# Patient Record
Sex: Female | Born: 1990 | Race: Black or African American | Hispanic: No | Marital: Single | Smoking: Never smoker
Health system: Southern US, Community
[De-identification: ages and names within clinical notes are randomized; demographics above are authoritative.]

---

## 2011-07-02 ENCOUNTER — Emergency Department (HOSPITAL_COMMUNITY): Payer: 59

## 2011-07-02 ENCOUNTER — Emergency Department (HOSPITAL_COMMUNITY)
Admission: EM | Admit: 2011-07-02 | Discharge: 2011-07-03 | Disposition: A | Discharge status: Home / Self Care | Payer: 59 | Attending: Emergency Medicine | Admitting: Emergency Medicine

## 2011-07-02 DIAGNOSIS — M7989 Other specified soft tissue disorders: Secondary | ICD-10-CM | POA: Insufficient documentation

## 2011-07-02 DIAGNOSIS — M25579 Pain in unspecified ankle and joints of unspecified foot: Secondary | ICD-10-CM | POA: Insufficient documentation

## 2011-07-02 DIAGNOSIS — X500XXA Overexertion from strenuous movement or load, initial encounter: Secondary | ICD-10-CM | POA: Insufficient documentation

## 2011-07-02 DIAGNOSIS — S93409A Sprain of unspecified ligament of unspecified ankle, initial encounter: Secondary | ICD-10-CM | POA: Insufficient documentation

## 2012-01-06 IMAGING — CR DG ANKLE COMPLETE 3+V*L*
3 series · 3 of 3 positions shown · non-contrast
Comparison: None.

CLINICAL DATA: Status post fall; rolled left ankle, with lateral
ankle pain and swelling.

LEFT ANKLE COMPLETE - 3+ VIEW

[t ankle joint ap left]
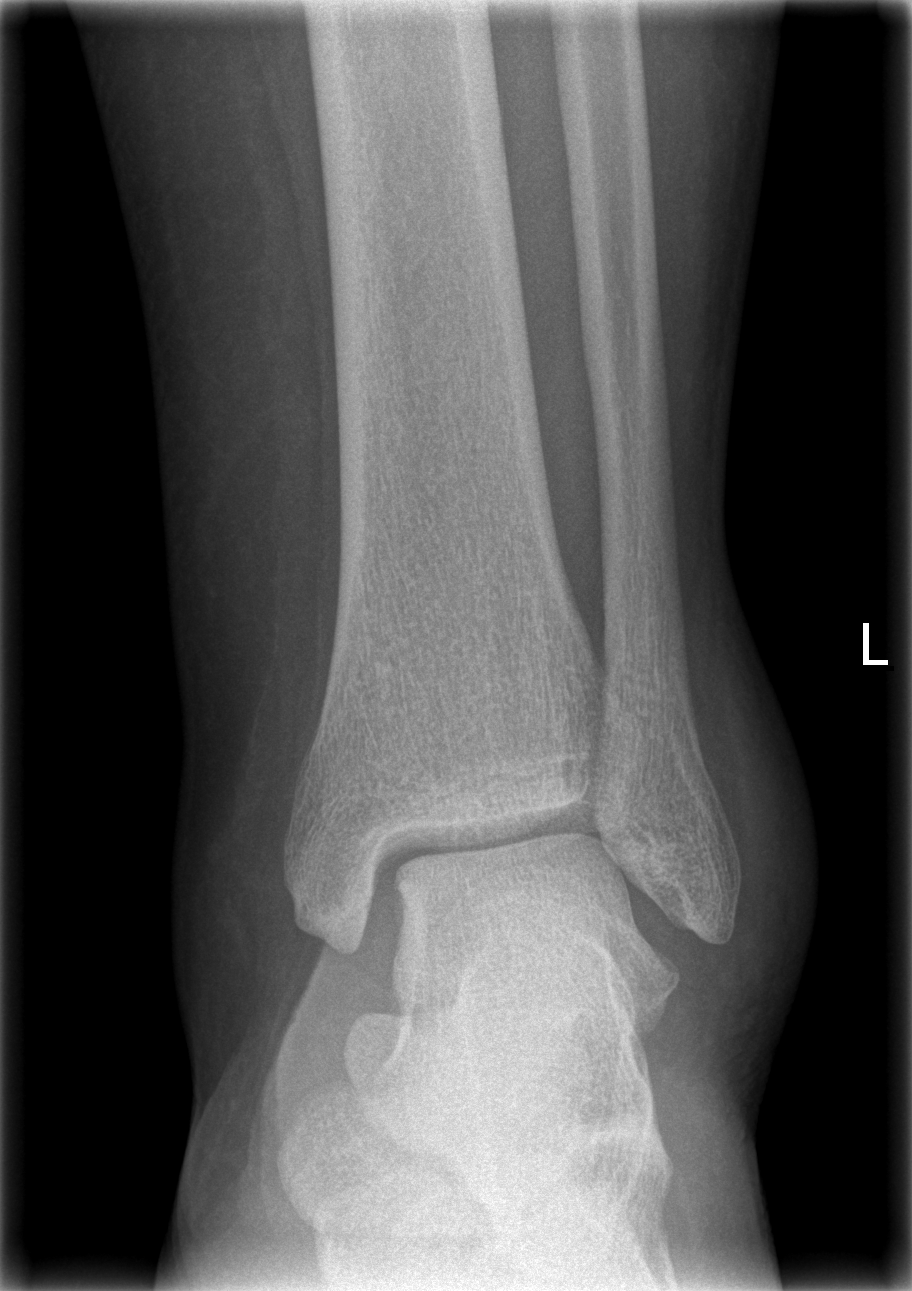

[t ankle joint oblique left]
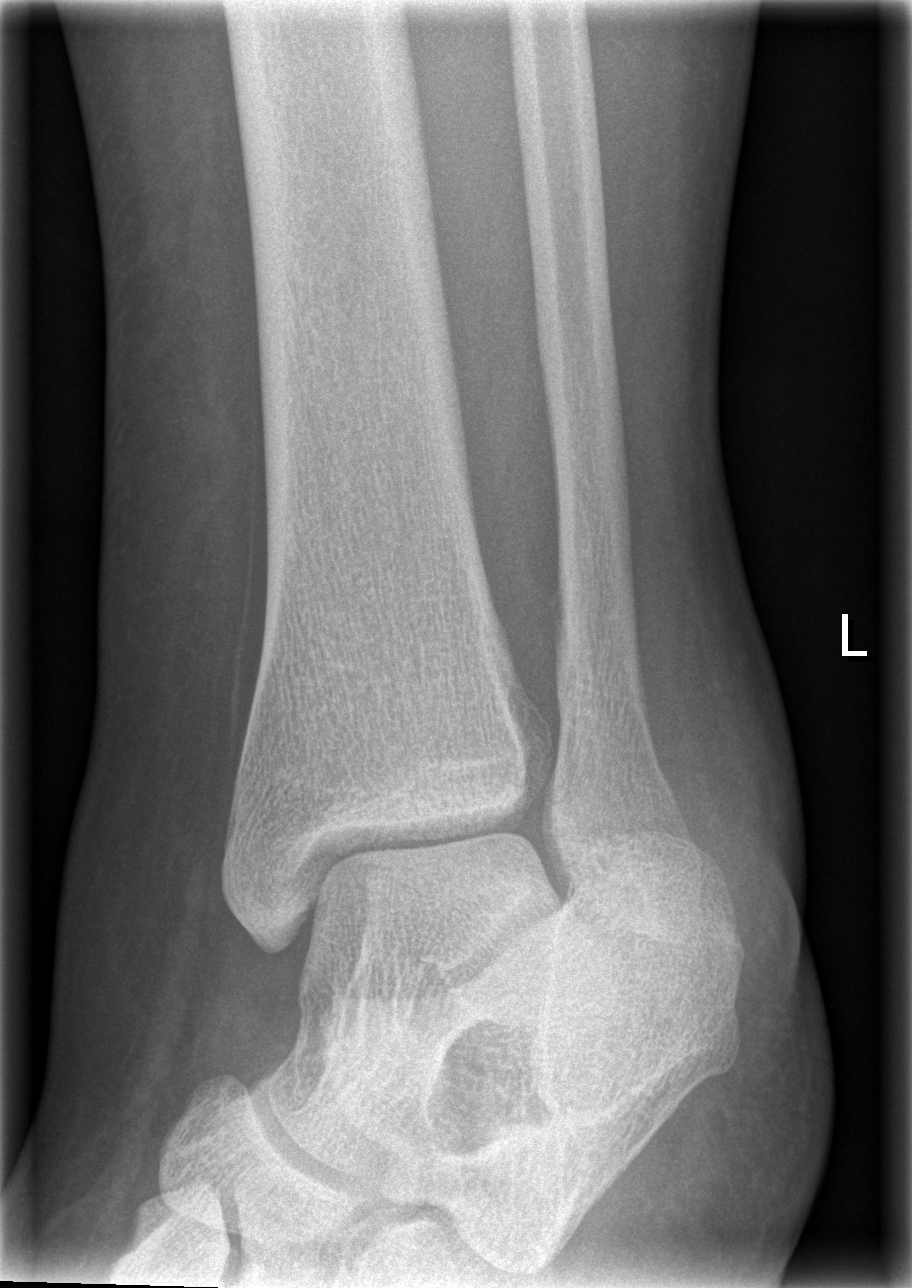

[t ankle joint lat left]
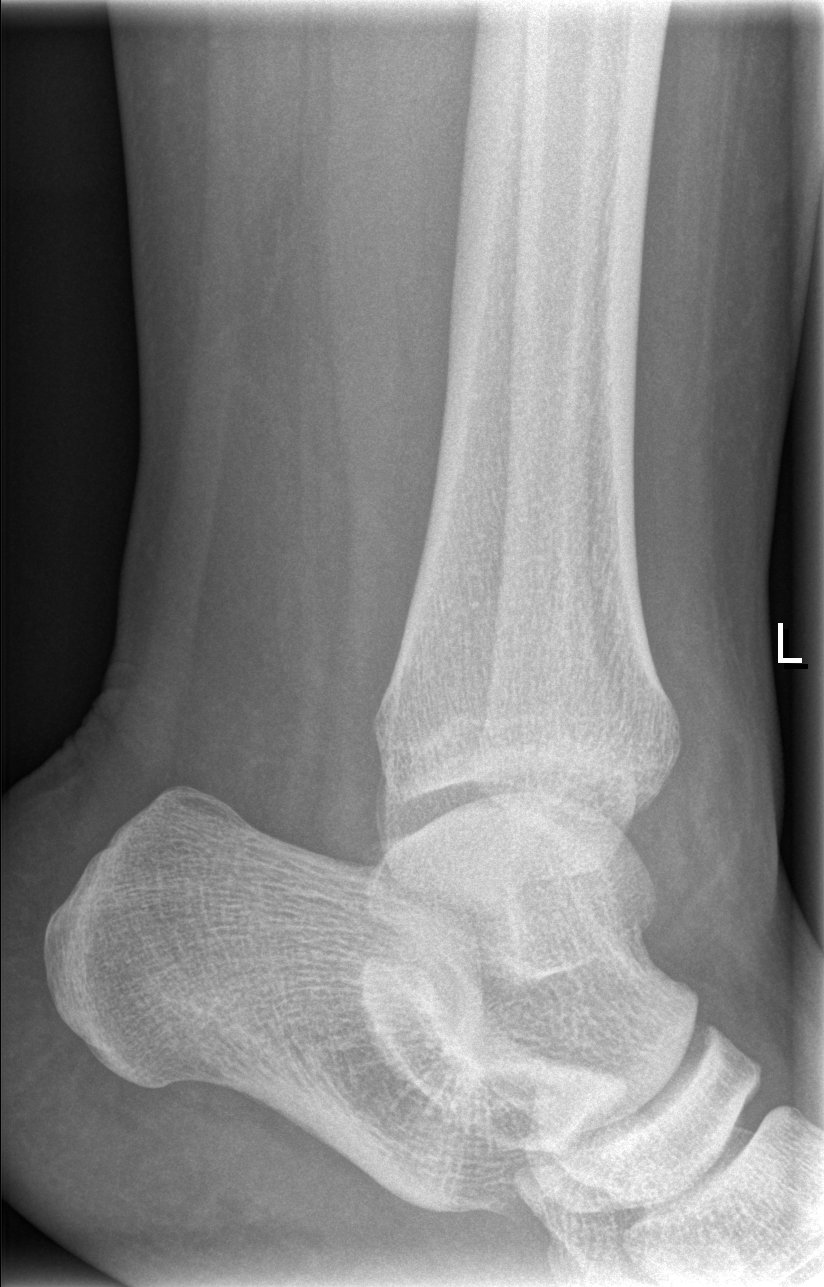

[3 of 3 positions shown; findings below may reference images not displayed]

FINDINGS: There is no evidence of fracture or dislocation.  The
ankle mortise is intact; the interosseous space is within normal
limits.  No talar tilt or subluxation is seen. Apparent pes planus
is noted.

The joint spaces are preserved.  Diffuse soft tissue swelling is
noted about the ankle.
IMPRESSION: 1.  No evidence of fracture or dislocation.
2.  Diffuse soft tissue swelling about the ankle.
3.  Apparent pes planus noted.

## 2014-08-30 ENCOUNTER — Ambulatory Visit (INDEPENDENT_AMBULATORY_CARE_PROVIDER_SITE_OTHER): Payer: 59 | Admitting: Emergency Medicine

## 2014-08-30 VITALS — BP 124/80 | HR 89 | Temp 98.2°F | Resp 18 | Ht 66.0 in | Wt 315.6 lb

## 2014-08-30 DIAGNOSIS — R059 Cough, unspecified: Secondary | ICD-10-CM

## 2014-08-30 DIAGNOSIS — J029 Acute pharyngitis, unspecified: Secondary | ICD-10-CM

## 2014-08-30 DIAGNOSIS — R05 Cough: Secondary | ICD-10-CM

## 2014-08-30 LAB — POCT RAPID STREP A (OFFICE): RAPID STREP A SCREEN: NEGATIVE

## 2014-08-30 MED ORDER — GUAIFENESIN ER 1200 MG PO TB12: 1.0000 | ORAL_TABLET | Freq: Two times a day (BID) | ORAL | Status: DC | PRN

## 2014-08-30 NOTE — Patient Instructions (Signed)
Drink plenty of water and get plenty of rest. We will let you know the results of your culture.

## 2014-08-30 NOTE — Progress Notes (Signed)
   Subjective:    Patient ID: Lauren Fleming, female    DOB: 11/01/1991, 23 y.o.   MRN: [id number]  Sore Throat  Associated symptoms include coughing. Pertinent negatives include no congestion, diarrhea, ear pain, headaches or vomiting.    This is a 23 year old female with no significant PMH here with 3 days of coughing, sneezing and sore throat. The cough is productive of green sputum. It is not keeping her up at night. She denies fever, chills, facial pain, otalgia, or GI symptoms. 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 She has taken dayquil without relief. She is not a smoker and does not have a history of asthma. She reports she got strep throat every fall until she was 23 years old. She has not had strep throat since that age.   Review of Systems  Constitutional: Positive for fatigue. Negative for fever.  HENT: Positive for sneezing and sore throat. Negative for congestion, ear pain and sinus pressure.   Eyes: Negative.   Respiratory: Positive for cough.   Gastrointestinal: Negative for nausea, vomiting and diarrhea.  Skin: Negative for rash.  Neurological: Negative for headaches.       Objective:   Physical Exam  Constitutional: She is oriented to person, place, and time. She appears well-developed and well-nourished. No distress.  HENT:  Head: Normocephalic and atraumatic.  Right Ear: Hearing, tympanic membrane, external ear and ear canal normal.  Left Ear: Hearing, tympanic membrane, external ear and ear canal normal.  Nose: Nose normal.  Mouth/Throat: Uvula is midline. Posterior oropharyngeal erythema present. No oropharyngeal exudate.  Tonsils are hypercryptic and erythematous,  2+ in size  Eyes: Conjunctivae and lids are normal. Right eye exhibits no discharge. Left eye exhibits no discharge. No scleral icterus.  Cardiovascular: Normal rate, regular rhythm, normal heart sounds and normal pulses.   Pulmonary/Chest: Effort normal and breath sounds  normal. No respiratory distress. She has no wheezes. She has no rhonchi. She has no rales.  Lymphadenopathy:    She has no cervical adenopathy.  Neurological: She is alert and oriented to person, place, and time.  Skin: Skin is warm, dry and intact.  Psychiatric: She has a normal mood and affect. Her speech is normal and behavior is normal. Thought content normal.    Results for orders placed in visit on 08/30/14  POCT RAPID STREP A (OFFICE)      Result Value Ref Range   Rapid Strep A Screen Negative  Negative      Assessment & Plan:  1. Acute pharyngitis, unspecified pharyngitis type  Patient's pharyngitis is likely viral. Rapid strep negative, culture pending. She will try salt water gargles, ibuprofen and/or tylenol for discomfort and mucinex for her cough. She will RTC  If symptoms fail to improve.  - POCT rapid strep A - Culture, Group A Strep - Guaifenesin (MUCINEX MAXIMUM STRENGTH) 1200 MG TB12; Take 1 tablet (1,200 mg total) by mouth every 12 (twelve) hours as needed.  Dispense: 14 tablet; Refill: 1   Aalaya Yadao V. Dyke BrackettBush, PA-C, MHS [facility]  08/30/2014

## 2014-08-31 LAB — CULTURE, GROUP A STREP

## 2014-09-01 ENCOUNTER — Other Ambulatory Visit: Payer: Self-pay | Admitting: Physician Assistant

## 2014-09-01 DIAGNOSIS — J020 Streptococcal pharyngitis: Secondary | ICD-10-CM

## 2014-09-01 MED ORDER — AMOXICILLIN 875 MG PO TABS: 875.0000 mg | ORAL_TABLET | Freq: Two times a day (BID) | ORAL | Status: AC

## 2014-09-26 ENCOUNTER — Ambulatory Visit (INDEPENDENT_AMBULATORY_CARE_PROVIDER_SITE_OTHER): Payer: 59 | Admitting: Physician Assistant

## 2014-09-26 VITALS — BP 116/82 | HR 82 | Temp 98.4°F | Resp 12 | Ht 66.0 in | Wt 312.6 lb

## 2014-09-26 DIAGNOSIS — J029 Acute pharyngitis, unspecified: Secondary | ICD-10-CM

## 2014-09-26 LAB — POCT RAPID STREP A (OFFICE): Rapid Strep A Screen: NEGATIVE

## 2014-09-26 MED ORDER — MAGIC MOUTHWASH W/LIDOCAINE: 10.0000 mL | ORAL | Status: AC | PRN

## 2014-09-26 MED ORDER — AMOXICILLIN-POT CLAVULANATE 875-125 MG PO TABS: 1.0000 | ORAL_TABLET | Freq: Two times a day (BID) | ORAL | Status: AC

## 2014-09-26 NOTE — Patient Instructions (Signed)
Get plenty of rest and drink at least 64 ounces of water daily.  Take acetaminophen and/or ibuprofen as needed for throat pain.

## 2014-09-26 NOTE — Progress Notes (Signed)
Subjective:    Patient ID: Lauren Fleming, female    DOB: 03/20/1991, 23 y.o.   MRN: [id number]   PCP: No PCP Per Patient  Chief Complaint  Patient presents with  . Sore Throat    strep about 2-3 weeks ago and she feels this is back     No Known Allergies  There are no active problems to display for this patient.   Prior to Admission medications   Not on File    Medical, Surgical, Family and Social History reviewed and updated.  HPI This 23 y.o. female presents for evaluation of sore throat.  Diagnosed with strep throat several weeks ago by culture (rapid strep was negative). Completed the antibiotics (Amoxicillin) without adverse effect.  "it kind of never fully went away." Throat felt tight with swallowing, hard to open her mouth really wide.  This morning awoke with recurrent pain like she had initially. No runny nose or cough. Feels like a mild drip in the throat, that she coughs up and spits out. Some loss of voice.  Review of Systems As above.    Objective:   Physical Exam  Constitutional: She is oriented to person, place, and time. Vital signs are normal. She appears well-developed and well-nourished. No distress.  BP 116/82 mmHg  Pulse 82  Temp(Src) 98.4 F (36.9 C) (Oral)  Resp 12  Ht 5\' 6"  (1.676 m)  Wt 312 lb 9.6 oz (141.794 kg)  BMI 50.48 kg/m2  SpO2 94%  LMP 09/16/2014   HENT:  Head: Normocephalic and atraumatic.  Right Ear: Hearing, tympanic membrane, external ear and ear canal normal.  Left Ear: Hearing, tympanic membrane, external ear and ear canal normal.  Nose: Mucosal edema and rhinorrhea present.  No foreign bodies. Right sinus exhibits no maxillary sinus tenderness and no frontal sinus tenderness. Left sinus exhibits no maxillary sinus tenderness and no frontal sinus tenderness.  Mouth/Throat: Uvula is midline and mucous membranes are normal. No uvula swelling. Posterior oropharyngeal edema and posterior oropharyngeal erythema present. No  oropharyngeal exudate or tonsillar abscesses.    Eyes: Conjunctivae and EOM are normal. Pupils are equal, round, and reactive to light. Right eye exhibits no discharge. Left eye exhibits no discharge. No scleral icterus.  Neck: Trachea normal, normal range of motion and full passive range of motion without pain. Neck supple. No thyroid mass and no thyromegaly present.  Cardiovascular: Normal rate, regular rhythm and normal heart sounds.   Pulmonary/Chest: Effort normal and breath sounds normal.  Lymphadenopathy:       Head (right side): No submandibular, no tonsillar, no preauricular, no posterior auricular and no occipital adenopathy present.       Head (left side): No submandibular, no tonsillar, no preauricular and no occipital adenopathy present.    She has no cervical adenopathy.       Right: No supraclavicular adenopathy present.       Left: No supraclavicular adenopathy present.  Neurological: She is alert and oriented to person, place, and time. She has normal strength. No cranial nerve deficit or sensory deficit.  Skin: Skin is warm, dry and intact. No rash noted.  Psychiatric: She has a normal mood and affect. Her speech is normal and behavior is normal.      Results for orders placed or performed in visit on 09/26/14  POCT rapid strep A  Result Value Ref Range   Rapid Strep A Screen Negative Negative       Assessment & Plan:  1. Acute pharyngitis, unspecified  pharyngitis type Cover for recurrent strep throat. Given mild fullness of the LEFT peritonsillar area, counseled on the importance of re-evaluation if symptoms worsen/persist. Hydrate. Rest. NSAIDS. - POCT rapid strep A - amoxicillin-clavulanate (AUGMENTIN) 875-125 MG per tablet; Take 1 tablet by mouth 2 (two) times daily.  Dispense: 20 tablet; Refill: 0 - Alum & Mag Hydroxide-Simeth (MAGIC MOUTHWASH W/LIDOCAINE) SOLN; Take 10 mLs by mouth every 2 (two) hours as needed for mouth pain.  Dispense: 360 mL; Refill: 0 -  Culture, Group A Strep   Fernande Brashelle S. Nicco Reaume, PA-C Physician Assistant-Certified [facility]

## 2014-09-28 ENCOUNTER — Ambulatory Visit (INDEPENDENT_AMBULATORY_CARE_PROVIDER_SITE_OTHER): Payer: 59 | Admitting: Physician Assistant

## 2014-09-28 VITALS — BP 126/86 | HR 97 | Temp 97.6°F | Resp 18 | Ht 66.0 in | Wt 307.0 lb

## 2014-09-28 DIAGNOSIS — R070 Pain in throat: Secondary | ICD-10-CM

## 2014-09-28 LAB — POCT CBC
GRANULOCYTE PERCENT: 77 % (ref 37–80)
HCT, POC: 41.3 % (ref 37.7–47.9)
Hemoglobin: 13.5 g/dL (ref 12.2–16.2)
LYMPH, POC: 2.1 (ref 0.6–3.4)
MCH, POC: 28.8 pg (ref 27–31.2)
MCHC: 32.7 g/dL (ref 31.8–35.4)
MCV: 88 fL (ref 80–97)
MID (CBC): 0.2 (ref 0–0.9)
MPV: 7.1 fL (ref 0–99.8)
PLATELET COUNT, POC: 339 10*3/uL (ref 142–424)
POC GRANULOCYTE: 7.8 — AB (ref 2–6.9)
POC LYMPH %: 20.7 % (ref 10–50)
POC MID %: 2.3 % (ref 0–12)
RBC: 4.69 M/uL (ref 4.04–5.48)
RDW, POC: 13.5 %
WBC: 10.1 10*3/uL (ref 4.6–10.2)

## 2014-09-28 NOTE — Progress Notes (Signed)
Subjective:    Patient ID: Lauren Fleming, female    DOB: 06/12/1991, 23 y.o.   MRN: [id number]  Chief Complaint  Patient presents with  . Follow-up  . Sore Throat     HPI  This is a 23 year old female who is here today for follow up of the acute pharyngitis.  He was first given amoxicillin about 30 days ago for 10 days.  2 weeks past and she still has the same sorethroat and is given the augmentin.  She is here for 48 hr (day 3/10 augmentin round) followup per provider request.  She reports that the sorethroat has not improved, but also has not worsened.   Sore throat has not gotten any better.  The sore throat feels about the same.  It hurts to talk.  Hurts to open mouth wide.  No fever, no chills.  Some headaches on left side of face.  No eye pain.  Ear pain at left side.  She had recurrent strep as a child, but has never lasted this long.    Review of Systems  Constitutional: Negative for fever.  HENT: Positive for drooling, ear pain and sore throat. Negative for ear discharge, postnasal drip and rhinorrhea.   Respiratory: Negative for apnea and cough.   Gastrointestinal: Negative for nausea, vomiting and diarrhea.  Neurological: Negative for dizziness, light-headedness and headaches.       Objective:   Physical Exam  Constitutional: She is oriented to person, place, and time. She appears well-developed and well-nourished. No distress.  BP 126/86 mmHg  Pulse 97  Temp(Src) 97.6 F (36.4 C) (Oral)  Resp 18  Ht 5\' 6"  (1.676 m)  Wt 307 lb (139.254 kg)  BMI 49.57 kg/m2  SpO2 98%  LMP 09/16/2014   HENT:  Head: Normocephalic and atraumatic.  Right Ear: Tympanic membrane is not injected.  Left Ear: There is tenderness (Tenderness when puling ear lobe during examination). Tympanic membrane is bulging. Tympanic membrane is not injected.  Nose: No mucosal edema or rhinorrhea. Right sinus exhibits no maxillary sinus tenderness and no frontal sinus tenderness. Left sinus exhibits no  maxillary sinus tenderness and no frontal sinus tenderness.  Mouth/Throat: There is trismus in the jaw. Posterior oropharyngeal edema and tonsillar abscesses (Left tonsillar swelling) present. No oropharyngeal exudate.  Eyes: Conjunctivae are normal. Pupils are equal, round, and reactive to light. Right eye exhibits no discharge. Left eye exhibits no discharge.  Neck: Normal range of motion. Neck supple. No rigidity. No tracheal deviation and no erythema present. No thyromegaly present.  Cardiovascular: Normal rate, regular rhythm and normal heart sounds.  Exam reveals no gallop and no friction rub.   No murmur heard. Pulmonary/Chest: Effort normal and breath sounds normal. No respiratory distress. She has no wheezes.  Lymphadenopathy:       Head (right side): No submandibular, no tonsillar, no preauricular and no posterior auricular adenopathy present.       Head (left side): Submandibular, tonsillar and preauricular adenopathy present. No posterior auricular adenopathy present.    She has no cervical adenopathy.  Neurological: She is alert and oriented to person, place, and time.  Skin: Skin is warm and dry.  Psychiatric: She has a normal mood and affect. Her behavior is normal. Judgment and thought content normal.    BP 126/86 mmHg  Pulse 97  Temp(Src) 97.6 F (36.4 C) (Oral)  Resp 18  Ht 5\' 6"  (1.676 m)  Wt 307 lb (139.254 kg)  BMI 49.57 kg/m2  SpO2  98%  LMP 09/16/2014  Results for orders placed or performed in visit on 09/28/14  POCT CBC  Result Value Ref Range   WBC 10.1 4.6 - 10.2 K/uL   Lymph, poc 2.1 0.6 - 3.4   POC LYMPH PERCENT 20.7 10 - 50 %L   MID (cbc) 0.2 0 - 0.9   POC MID % 2.3 0 - 12 %M   POC Granulocyte 7.8 (A) 2 - 6.9   Granulocyte percent 77.0 37 - 80 %G   RBC 4.69 4.04 - 5.48 M/uL   Hemoglobin 13.5 12.2 - 16.2 g/dL   HCT, POC 16.141.3 09.637.7 - 47.9 %   MCV 88.0 80 - 97 fL   MCH, POC 28.8 27 - 31.2 pg   MCHC 32.7 31.8 - 35.4 g/dL   RDW, POC 04.513.5 %   Platelet  Count, POC 339 142 - 424 K/uL   MPV 7.1 0 - 99.8 fL       Assessment & Plan:  23 year old female is here today for followup of sore throat and pharyngitis that has not resolved for about 1 month.   Throat pain  POCT CBC Very suspicious that this is a peritonsillar abscess.  Diff dx: atypical pharyngitis, cellulitis, retropharyngeal abscess (although neck ROM) is wnl.  [facility] workup and confirmation of illness.      Trena PlattStephanie Andreanna Mikolajczak, PA-C [facility] 11/20/201512:52 PM

## 2014-09-28 NOTE — Progress Notes (Signed)
I was directly involved with the patient's care and agree with the diagnosis and treatment plan.  

## 2014-09-28 NOTE — Patient Instructions (Signed)
[facility] - please go now to this office for your evaluation. Address: [address],  [place], [place] [address]  [phone]

## 2014-09-29 LAB — CULTURE, GROUP A STREP: ORGANISM ID, BACTERIA: NORMAL

## 2015-07-12 ENCOUNTER — Ambulatory Visit (INDEPENDENT_AMBULATORY_CARE_PROVIDER_SITE_OTHER): Payer: 59 | Admitting: Emergency Medicine

## 2015-07-12 VITALS — BP 110/68 | HR 96 | Temp 98.5°F | Resp 16 | Ht 66.0 in | Wt 327.0 lb

## 2015-07-12 DIAGNOSIS — J039 Acute tonsillitis, unspecified: Secondary | ICD-10-CM

## 2015-07-12 MED ORDER — AMOXICILLIN-POT CLAVULANATE 875-125 MG PO TABS: 1.0000 | ORAL_TABLET | Freq: Two times a day (BID) | ORAL | Status: AC

## 2015-07-12 MED ORDER — HYDROCODONE-ACETAMINOPHEN 5-325 MG PO TABS: 1.0000 | ORAL_TABLET | ORAL | Status: AC | PRN

## 2015-07-12 NOTE — Patient Instructions (Signed)

## 2015-07-12 NOTE — Progress Notes (Signed)
Subjective:  Patient ID: Lauren Fleming, female    DOB: 1991-07-14  Age: 24 y.o. MRN: [id number]  CC: Sore Throat and Chills   HPI [name] presents  with a sore throat over the last 3 days. She has a muffled voice and marked sore throat. She has no fever but has chills. She has no difficulty swallowing. She has no airway difficulty. She has no nasal congestion or postnasal drainage no nasal discharge no ear pain no cough or wheezing or shortness of breath. No nausea or vomiting. Takes no medication she has a history of a prior peritonsillar abscess  History [name] has no past medical history on file.   She has no past surgical history on file.   Her  family history includes Cancer in her paternal grandmother; Diabetes in her father, maternal grandfather, mother, paternal grandfather, and paternal grandmother.  She   reports that she has never smoked. She has never used smokeless tobacco. She reports that she drinks about 1.8 - 2.4 oz of alcohol per week. She reports that she does not use illicit drugs.  Outpatient Prescriptions Prior to Visit  Medication Sig Dispense Refill  . Alum & Mag Hydroxide-Simeth (MAGIC MOUTHWASH W/LIDOCAINE) SOLN Take 10 mLs by mouth every 2 (two) hours as needed for mouth pain. (Patient not taking: Reported on 07/12/2015) 360 mL 0   No facility-administered medications prior to visit.    Social History   Social History  . Marital Status: Single    Spouse Name: n/a  . Number of Children: 0  . Years of Education: BFA   Occupational History  . nanny    Social History Main Topics  . Smoking status: Never Smoker   . Smokeless tobacco: Never Used  . Alcohol Use: 1.8 - 2.4 oz/week    3-4 Standard drinks or equivalent per week  . Drug Use: No  . Sexual Activity: Not Asked   Other Topics Concern  . None   Social History Narrative   BFA in [employer].   Lives with 2 roommates.   Family lives in [place], [place]     Review of Systems    Constitutional: Positive for chills. Negative for fever and appetite change.  HENT: Positive for sore throat. Negative for congestion, ear pain, postnasal drip and sinus pressure.   Eyes: Negative for pain and redness.  Respiratory: Negative for cough, shortness of breath and wheezing.   Cardiovascular: Negative for leg swelling.  Gastrointestinal: Negative for nausea, vomiting, abdominal pain, diarrhea, constipation and blood in stool.  Endocrine: Negative for polyuria.  Genitourinary: Negative for dysuria, urgency, frequency and flank pain.  Musculoskeletal: Negative for gait problem.  Skin: Negative for rash.  Neurological: Negative for weakness and headaches.  Psychiatric/Behavioral: Negative for confusion and decreased concentration. The patient is not nervous/anxious.     Objective:  BP 110/68 mmHg  Pulse 96  Temp(Src) 98.5 F (36.9 C) (Oral)  Resp 16  Ht  (1.676 m)  Wt 327 lb (148.326 kg)  BMI 52.80 kg/m2  SpO2 98%  LMP 06/28/2015  Physical Exam  Constitutional: She is oriented to person, place, and time. She appears well-developed and well-nourished. No distress.  HENT:  Head: Normocephalic and atraumatic.  Right Ear: External ear normal.  Left Ear: External ear normal.  Nose: Nose normal.  Mouth/Throat: Oropharyngeal exudate, posterior oropharyngeal edema and posterior oropharyngeal erythema present. No tonsillar abscesses.  Eyes: Conjunctivae and EOM are normal. Pupils are equal, round, and reactive to light. No scleral  icterus.  Neck: Normal range of motion. Neck supple. No tracheal deviation present.  Cardiovascular: Normal rate, regular rhythm and normal heart sounds.   Pulmonary/Chest: Effort normal. No respiratory distress. She has no wheezes. She has no rales.  Abdominal: She exhibits no mass. There is no tenderness. There is no rebound and no guarding.  Musculoskeletal: She exhibits no edema.  Lymphadenopathy:    She has no cervical adenopathy.   Neurological: She is alert and oriented to person, place, and time. Coordination normal.  Skin: Skin is warm and dry. No rash noted.  Psychiatric: She has a normal mood and affect. Her behavior is normal.   Her tonsils are bilaterally enlarged. Erythematous with an exudate. Is no evidence of peritonsillar abscess   Assessment & Plan:   Basha was seen today for sore throat and chills.  Diagnoses and all orders for this visit:  Acute tonsillitis  Other orders -     amoxicillin-clavulanate (AUGMENTIN) 875-125 MG per tablet; Take 1 tablet by mouth 2 (two) times daily. -     HYDROcodone-acetaminophen (NORCO) 5-325 MG per tablet; Take 1-2 tablets by mouth every 4 (four) hours as needed.   I am having Ms. Leavy start on amoxicillin-clavulanate and HYDROcodone-acetaminophen. I am also having her maintain her magic mouthwash w/lidocaine.  Meds ordered this encounter  Medications  . amoxicillin-clavulanate (AUGMENTIN) 875-125 MG per tablet    Sig: Take 1 tablet by mouth 2 (two) times daily.    Dispense:  20 tablet    Refill:  0  . HYDROcodone-acetaminophen (NORCO) 5-325 MG per tablet    Sig: Take 1-2 tablets by mouth every 4 (four) hours as needed.    Dispense:  30 tablet    Refill:  0    Appropriate red flag conditions were discussed with the patient as well as actions that should be taken.  Patient expressed his understanding.  Follow-up: Return if symptoms worsen or fail to improve.  Carmelina Dane, MD
